# Patient Record
Sex: Male | Born: 2000 | Race: Black or African American | Hispanic: No | Marital: Single | Smoking: Never smoker
Health system: Southern US, Community
[De-identification: ages and names within clinical notes are randomized; demographics above are authoritative.]

## PROBLEM LIST (undated history)

## (undated) DIAGNOSIS — J309 Allergic rhinitis, unspecified: Secondary | ICD-10-CM

## (undated) DIAGNOSIS — J45909 Unspecified asthma, uncomplicated: Secondary | ICD-10-CM

## (undated) HISTORY — DX: Allergic rhinitis, unspecified: J30.9

## (undated) HISTORY — PX: TYMPANOSTOMY TUBE PLACEMENT: SHX32

## (undated) HISTORY — PX: ADENOIDECTOMY: SUR15

---
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
## 2021-01-04 DIAGNOSIS — J209 Acute bronchitis, unspecified: Secondary | ICD-10-CM | POA: Diagnosis not present

## 2021-01-04 DIAGNOSIS — H6690 Otitis media, unspecified, unspecified ear: Secondary | ICD-10-CM | POA: Diagnosis not present

## 2021-01-04 DIAGNOSIS — J019 Acute sinusitis, unspecified: Secondary | ICD-10-CM | POA: Diagnosis not present

## 2021-02-22 DIAGNOSIS — J45909 Unspecified asthma, uncomplicated: Secondary | ICD-10-CM | POA: Diagnosis not present

## 2021-02-22 DIAGNOSIS — Z6827 Body mass index (BMI) 27.0-27.9, adult: Secondary | ICD-10-CM | POA: Diagnosis not present

## 2021-02-22 DIAGNOSIS — J309 Allergic rhinitis, unspecified: Secondary | ICD-10-CM | POA: Diagnosis not present

## 2021-02-22 DIAGNOSIS — J019 Acute sinusitis, unspecified: Secondary | ICD-10-CM | POA: Diagnosis not present

## 2021-04-21 DIAGNOSIS — Z719 Counseling, unspecified: Secondary | ICD-10-CM | POA: Diagnosis not present

## 2022-11-07 NOTE — Progress Notes (Unsigned)
A[facility] Sells D[facility] [address] [place] [address] Phone: [phone]  Assessment and Plan:     There are no diagnoses linked to this encounter.  ***    Date of injury was 07/01/2022. Original symptom severity scores were *** and ***. The patient was counseled on the nature of the injury, typical course and potential options for further evaluation and treatment. Discussed the importance of compliance with recommendations. Patient stated understanding of this plan and willingness to comply.  Recommendations:  -  Relative mental and physical rest for 48 hours after concussive event - Recommend light aerobic activity while keeping symptoms less than 3/10 - Stop mental or physical activities that cause symptoms to worsen greater than 3/10, and wait 24 hours before attempting them again - Eliminate screen time as much as possible for first 48 hours after concussive event, then continue limited screen time (recommend less than 2 hours per day)   - Encouraged to RTC in *** for reassessment or sooner for any concerns or acute changes   Pertinent previous records reviewed include ***   Time of visit *** minutes, which included chart review, physical exam, treatment plan, symptom severity score, VOMS, and tandem gait testing being performed, interpreted, and discussed with patient at today's visit.   Subjective:   I, Jerene Canny, am serving as a Neurosurgeon for Doctor Richardean Sale  Chief Complaint: concussion symptoms   HPI:   11/08/2022 Patient is a 21 year old male complaining of concussion symptoms. Patient states   Concussion HPI:  - Injury date: 07/01/2022   - Mechanism of injury: football   - LOC: ***  - Initial evaluation: ***  - Previous head injuries/concussions: ***   - Previous imaging: ***    - Social history: Student at ***, activities include ***   Hospitalization for head injury? No*** Diagnosed/treated for headache disorder,  migraines, or seizures? No*** Diagnosed with learning disability Elnita Maxwell? No*** Diagnosed with ADD/ADHD? No*** Diagnose with Depression, anxiety, or other Psychiatric Disorder? No***   Current medications:  Current Outpatient Medications  Medication Sig Dispense Refill   albuterol (PROAIR HFA) 108 (90 Base) MCG/ACT inhaler ProAir HFA 90 mcg/actuation aerosol inhaler     albuterol (PROVENTIL) (2.5 MG/3ML) 0.083% nebulizer solution Can use one dose in nebulizer every four to six hours as needed for cough or wheeze. 90 mL 1   cetirizine (ZYRTEC) 10 MG tablet TAKE ONE TABLET BY MOUTH DAILY AS NEEDEDFOR RUNNY NOSE 30 tablet 0   ipratropium-albuterol (DUONEB) 0.5-2.5 (3) MG/3ML SOLN ipratropium-albuterol 0.5 mg-3 mg(2.5 mg base)/3 mL nebulization soln     mometasone (NASONEX) 50 MCG/ACT nasal spray Place 1 spray into the nose 2 (two) times daily as needed (for stuffy nose). 17 g 5   montelukast (SINGULAIR) 10 MG tablet TAKE 1 TABLET BY MOUTH ONCE DAILY TO PREVENT COUGHING OR WHEEZING. 30 tablet 0   mupirocin ointment (BACTROBAN) 2 % Apply to affected areas as directed three times daily for 10 days 30 g 0   No current facility-administered medications for this visit.      Objective:     There were no vitals filed for this visit.    There is no height or weight on file to calculate BMI.    Physical Exam:     General: Well-appearing, cooperative, sitting comfortably in no acute distress.  Psychiatric: Mood and affect are appropriate.   Neuro:sensation intact and strength 5/5 with no deficits, no atrophy, normal muscle tone  Today's Symptom Severity Score:  Scores: 0-6  Headache:*** "Pressure in head":***  Neck Pain:*** Nausea or vomiting:*** Dizziness:*** Blurred vision:*** Balance problems:*** Sensitivity to light:*** Sensitivity to noise:*** Feeling slowed down:*** Feeling like "in a fog":*** "Don't feel right":*** Difficulty concentrating:*** Difficulty remembering:***   Fatigue or low energy:*** Confusion:***  Drowsiness:***  More emotional:*** Irritability:*** Sadness:***  Nervous or Anxious:*** Trouble falling or staying asleep:***  Total number of symptoms: ***/22  Symptom Severity index: ***/132  Worse with physical activity? No*** Worse with mental activity? No*** Percent improved since injury: ***%    Full pain-free cervical PROM: yes***    Cognitive:  - Months backwards: *** Mistakes. *** seconds  mVOMS:   - Baseline symptoms: *** - Horizontal Vestibular-Ocular Reflex: ***/10  - Smooth pursuits: ***/10  - Horizontal Saccades:  ***/10  - Visual Motion Sensitivity Test:  ***/10  - Convergence: ***cm (<5 cm normal)    Autonomic:  - Symptomatic with supine to standing: No***  Complex Tandem Gait: - Forward, eyes open: *** errors - Backward, eyes open: *** errors - Forward, eyes closed: *** errors - Backward, eyes closed: *** errors  Electronically signed by:  Aleen [facility]ells [facility] 12:18 PM 11/07/22

## 2022-11-08 ENCOUNTER — Telehealth: Payer: Self-pay | Admitting: Sports Medicine

## 2022-11-08 ENCOUNTER — Ambulatory Visit: Payer: BC Managed Care – PPO | Admitting: Sports Medicine

## 2022-11-08 VITALS — BP 120/80 | HR 71 | Ht 72.0 in | Wt 250.0 lb

## 2022-11-08 DIAGNOSIS — S060X0A Concussion without loss of consciousness, initial encounter: Secondary | ICD-10-CM | POA: Diagnosis not present

## 2022-11-08 DIAGNOSIS — R4189 Other symptoms and signs involving cognitive functions and awareness: Secondary | ICD-10-CM | POA: Diagnosis not present

## 2022-11-08 MED ORDER — AMANTADINE HCL 100 MG PO CAPS: 100.0000 mg | ORAL_CAPSULE | Freq: Two times a day (BID) | ORAL | 0 refills | Status: AC

## 2022-11-08 NOTE — Telephone Encounter (Signed)
Attempted to call pt and VM was full will attempt to call tomorrow 11/09/2022

## 2022-11-08 NOTE — Patient Instructions (Addendum)
Good to see you  Start amantadine 100 mg 2 times a day for 2 months  Cognitive referral  2-3 week follow up

## 2022-11-08 NOTE — Telephone Encounter (Signed)
Please see message below received in reference to the referral sent to [facility].  "We dont see concussions here. Not seeing anyone under the age 20 for memory with merz due to staffing."  Please advise on next steps?
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
## 2022-11-22 ENCOUNTER — Ambulatory Visit: Payer: BC Managed Care – PPO | Admitting: Sports Medicine

## 2022-11-22 VITALS — BP 120/80 | HR 64 | Ht 72.0 in | Wt 250.0 lb

## 2022-11-22 DIAGNOSIS — R4189 Other symptoms and signs involving cognitive functions and awareness: Secondary | ICD-10-CM | POA: Diagnosis not present

## 2022-11-22 DIAGNOSIS — S060X0A Concussion without loss of consciousness, initial encounter: Secondary | ICD-10-CM | POA: Diagnosis not present

## 2022-11-22 NOTE — Patient Instructions (Signed)
No testing for 3-4 weeks  Continue amantadine 100 mg two times a day for a full 2 months  3-4 week follow up
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
## 2022-12-11 ENCOUNTER — Ambulatory Visit: Payer: BC Managed Care – PPO | Admitting: Sports Medicine

## 2022-12-11 VITALS — BP 118/80 | HR 85 | Ht 72.0 in | Wt 235.0 lb

## 2022-12-11 DIAGNOSIS — G44329 Chronic post-traumatic headache, not intractable: Secondary | ICD-10-CM

## 2022-12-11 DIAGNOSIS — R4189 Other symptoms and signs involving cognitive functions and awareness: Secondary | ICD-10-CM | POA: Diagnosis not present

## 2022-12-11 DIAGNOSIS — S060X0A Concussion without loss of consciousness, initial encounter: Secondary | ICD-10-CM | POA: Diagnosis not present

## 2022-12-11 NOTE — Patient Instructions (Addendum)
Good to see you  School note cleared  4 week follow up

## 2022-12-12 ENCOUNTER — Encounter: Payer: BC Managed Care – PPO | Admitting: Sports Medicine
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
## 2023-01-08 ENCOUNTER — Encounter: Payer: BC Managed Care – PPO | Admitting: Sports Medicine
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
## 2023-01-10 ENCOUNTER — Ambulatory Visit: Payer: BC Managed Care – PPO | Admitting: Sports Medicine

## 2023-01-10 VITALS — BP 122/80 | HR 81 | Ht 72.0 in | Wt 236.0 lb

## 2023-01-10 DIAGNOSIS — S060X0A Concussion without loss of consciousness, initial encounter: Secondary | ICD-10-CM

## 2023-01-10 DIAGNOSIS — F0781 Postconcussional syndrome: Secondary | ICD-10-CM

## 2023-01-10 DIAGNOSIS — R4189 Other symptoms and signs involving cognitive functions and awareness: Secondary | ICD-10-CM | POA: Diagnosis not present

## 2023-01-10 NOTE — Patient Instructions (Signed)
Good to see you   

## 2024-04-14 ENCOUNTER — Ambulatory Visit: Admitting: Physician Assistant

## 2024-10-28 ENCOUNTER — Ambulatory Visit: Admitting: Physician Assistant
